# Patient Record
Sex: Male | Born: 2000 | Hispanic: No | Marital: Single | Smoking: Former smoker
Health system: Southern US, Community
[De-identification: ages and names within clinical notes are randomized; demographics above are authoritative.]

## PROBLEM LIST (undated history)

## (undated) DIAGNOSIS — A045 Campylobacter enteritis: Secondary | ICD-10-CM

## (undated) DIAGNOSIS — A498 Other bacterial infections of unspecified site: Secondary | ICD-10-CM

## (undated) DIAGNOSIS — A029 Salmonella infection, unspecified: Secondary | ICD-10-CM

## (undated) HISTORY — DX: Other bacterial infections of unspecified site: A49.8

## (undated) HISTORY — DX: Salmonella infection, unspecified: A02.9

## (undated) HISTORY — DX: Campylobacter enteritis: A04.5

---
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
## 2021-02-02 ENCOUNTER — Ambulatory Visit: Payer: Self-pay | Admitting: Family

## 2021-02-14 ENCOUNTER — Ambulatory Visit
Admission: EM | Admit: 2021-02-14 | Discharge: 2021-02-14 | Disposition: A | Discharge status: Home / Self Care | Payer: Medicaid Other | Attending: Family Medicine | Admitting: Family Medicine

## 2021-02-14 ENCOUNTER — Other Ambulatory Visit: Payer: Self-pay

## 2021-02-14 ENCOUNTER — Ambulatory Visit: Payer: Self-pay

## 2021-02-14 DIAGNOSIS — K5900 Constipation, unspecified: Secondary | ICD-10-CM

## 2021-02-14 DIAGNOSIS — R1013 Epigastric pain: Secondary | ICD-10-CM

## 2021-02-14 MED ORDER — ESOMEPRAZOLE MAGNESIUM 20 MG PO CPDR: DELAYED_RELEASE_CAPSULE | ORAL | 0 refills | Status: DC

## 2021-02-14 MED ORDER — SUCRALFATE 1 G PO TABS: 1.0000 g | ORAL_TABLET | Freq: Three times a day (TID) | ORAL | 0 refills | Status: DC

## 2021-02-14 NOTE — ED Provider Notes (Signed)
  [facility]   [id number] 02/14/21 Arrival Time: 1062  ASSESSMENT & PLAN:  1. Dyspepsia   2. Constipation, unspecified constipation type     Benign abdominal exam. No indications for urgent abdominal/pelvic imaging at this time. Reassured. Symptoms overwhelmingly consistent with acid reflux + constipation.  Meds ordered this encounter  Medications  . esomeprazole (NEXIUM) 20 MG capsule    Sig: Take one capsule daily.    Dispense:  30 capsule    Refill:  0  . sucralfate (CARAFATE) 1 g tablet    Sig: Take 1 tablet (1 g total) by mouth 4 (four) times daily -  with meals and at bedtime.    Dispense:  28 tablet    Refill:  0     Discharge Instructions     Begin taking MIRALAX for your constipation.     Reviewed expectations re: course of current medical issues. Questions answered. Outlined signs and symptoms indicating need for more acute intervention. Patient verbalized understanding. After Visit Summary given.   SUBJECTIVE: History from: patient. Angel Beasley is a 20 y.o. male who presents with complaint of abdominal discomfort desc as feeling bloated; gradual onset past sev days; belching frequently; symptoms worse after eating. Without n/v/d. Does report feeling constipated over past week. Small non-bloody BM this am. Pepto Bismol without relief. No SOB.  Social History   Tobacco Use  Smoking Status Former Smoker  Smokeless Tobacco Never Used    History reviewed. No pertinent surgical history.   OBJECTIVE: General appearance: alert, oriented, no acute distress HEENT: [place]; AT; oropharynx moist Lungs: unlabored respirations Abdomen: soft; without distention; no specific tenderness to palpation; without masses or organomegaly; without guarding or rebound tenderness Back: without reported CVA tenderness; FROM at waist Extremities: without LE edema; symmetrical; without gross deformities Skin: warm and dry Neurologic: normal gait Psychological:  alert and cooperative; normal mood and affect   No Known Allergies                                             History reviewed. No pertinent past medical history.  Social History   Socioeconomic History  . Marital status: Single    Spouse name: Not on file  . Number of children: Not on file  . Years of education: Not on file  . Highest education level: Not on file  Occupational History  . Not on file  Tobacco Use  . Smoking status: Former Games developer  . Smokeless tobacco: Never Used  Substance and Sexual Activity  . Alcohol use: Not on file  . Drug use: Not on file  . Sexual activity: Not on file  Other Topics Concern  . Not on file  Social History Narrative  . Not on file   Social Determinants of Health   Financial Resource Strain: Not on file  Food Insecurity: Not on file  Transportation Needs: Not on file  Physical Activity: Not on file  Stress: Not on file  Social Connections: Not on file  Intimate Partner Violence: Not on file    Family History  Problem Relation Age of Onset  . Diabetes Mother   . Hypertension Mother   . Diabetes Father   . Hypertension Father      Mardella Layman, MD 02/14/21 [phone]

## 2021-02-14 NOTE — ED Triage Notes (Signed)
Pt presents with c/o heartburn since wednesday , took several dosed of pepto and now has constipation

## 2021-02-14 NOTE — Discharge Instructions (Addendum)
Begin taking MIRALAX for your constipation.

## 2021-03-05 ENCOUNTER — Encounter: Payer: Self-pay | Admitting: Nurse Practitioner

## 2021-03-05 ENCOUNTER — Other Ambulatory Visit: Payer: Self-pay

## 2021-03-05 DIAGNOSIS — Z87891 Personal history of nicotine dependence: Secondary | ICD-10-CM | POA: Insufficient documentation

## 2021-03-05 DIAGNOSIS — K219 Gastro-esophageal reflux disease without esophagitis: Secondary | ICD-10-CM | POA: Insufficient documentation

## 2021-03-05 DIAGNOSIS — R1310 Dysphagia, unspecified: Secondary | ICD-10-CM | POA: Insufficient documentation

## 2021-03-05 DIAGNOSIS — G44229 Chronic tension-type headache, not intractable: Secondary | ICD-10-CM | POA: Insufficient documentation

## 2021-03-22 ENCOUNTER — Other Ambulatory Visit: Payer: Self-pay

## 2021-03-22 ENCOUNTER — Encounter: Payer: Self-pay | Admitting: *Deleted

## 2021-03-22 ENCOUNTER — Ambulatory Visit
Admission: EM | Admit: 2021-03-22 | Discharge: 2021-03-22 | Disposition: A | Discharge status: Home / Self Care | Payer: Medicaid Other | Attending: Emergency Medicine | Admitting: Emergency Medicine

## 2021-03-22 DIAGNOSIS — R1033 Periumbilical pain: Secondary | ICD-10-CM

## 2021-03-22 MED ORDER — ESOMEPRAZOLE MAGNESIUM 20 MG PO CPDR: DELAYED_RELEASE_CAPSULE | ORAL | 0 refills | Status: DC

## 2021-03-22 MED ORDER — SUCRALFATE 1 G PO TABS: 1.0000 g | ORAL_TABLET | Freq: Three times a day (TID) | ORAL | 0 refills | Status: DC

## 2021-03-22 MED ORDER — ALUM & MAG HYDROXIDE-SIMETH 200-200-20 MG/5ML PO SUSP
30.0000 mL | Freq: Once | ORAL | Status: AC
  Administered 2021-03-22: 30 mL via ORAL

## 2021-03-22 MED ORDER — LIDOCAINE VISCOUS HCL 2 % MT SOLN
15.0000 mL | Freq: Once | OROMUCOSAL | Status: AC
  Administered 2021-03-22: 15 mL via ORAL

## 2021-03-22 NOTE — ED Triage Notes (Signed)
Pt reports ABD pain that is different from his last visit. Pt took Pepto with out relief. Pt also reports ferquent stools last night.

## 2021-03-22 NOTE — Discharge Instructions (Signed)
Restart daily Nexium Carafate before meals and bedtime Follow-up with gastroenterology as planned Go to emergency room if pain worsening

## 2021-03-22 NOTE — ED Provider Notes (Signed)
[name]    CSN: [id number] Arrival date & time: 03/22/21  1406      History   Chief Complaint Chief Complaint  Patient presents with  . Abdominal Pain    HPI Angel Beasley is a 20 y.o. male presenting today for evaluation of abdominal pain.  Reports approximately 2 to 3 days ago developed a headache with pressure-like sensation on top of his head with associated fatigue, took ibuprofen for this yesterday subsequently developed abdominal pain yesterday afternoon.  Reports pain causing him to be in fetal position and any movement would trigger worsening pain.  Reports pain slightly improved today, described as a crampy/stabbing sensation.  Mainly located in middle abdomen.  Reports bowel slightly looser than normal, but denies any sensations of constipation.  Reports daily morning bowel movements without straining.  Has felt slight nauseous, but no significant vomiting.  Previously on Nexium and Carafate.  Reports significant improvement with Carafate.  Feels need to burp and belch and release air.  Has plans to follow-up with gastroenterology in 2 days on Tuesday.  Denies any recent alcohol use, last used 3 weeks ago.  Quit tobacco use last January.  Denies urinary symptoms.    HPI  History reviewed. No pertinent past medical history.  Patient Active Problem List   Diagnosis Date Noted  . Chronic tension-type headache 03/05/2021  . Dysphagia 03/05/2021  . Former smoker 03/05/2021  . Gastroesophageal reflux disease 03/05/2021  . Seasonal allergic rhinitis due to pollen 04/20/2016    History reviewed. No pertinent surgical history.     Home Medications    Prior to Admission medications   Medication Sig Start Date End Date Taking? Authorizing Provider  esomeprazole (NEXIUM) 20 MG capsule Take one capsule daily. 03/22/21   Yael Angerer C, PA-C  fexofenadine (ALLEGRA) 60 MG tablet Take by mouth.    [provider]  ibuprofen (ADVIL) 800 MG tablet 1  tab(s)    [provider]  sucralfate (CARAFATE) 1 g tablet Take 1 tablet (1 g total) by mouth 4 (four) times daily -  with meals and at bedtime. 03/22/21   Shakinah Navis, Junius Creamer, PA-C    Family History Family History  Problem Relation Age of Onset  . Diabetes Mother   . Hypertension Mother   . Diabetes Father   . Hypertension Father     Social History Social History   Tobacco Use  . Smoking status: Former Games developer  . Smokeless tobacco: Never Used     Allergies   Patient has no known allergies.   Review of Systems Review of Systems  Constitutional: Negative for fatigue and fever.  Eyes: Negative for redness, itching and visual disturbance.  Respiratory: Negative for shortness of breath.   Cardiovascular: Negative for chest pain and leg swelling.  Gastrointestinal: Positive for abdominal pain and nausea. Negative for vomiting.  Musculoskeletal: Negative for arthralgias and myalgias.  Skin: Negative for color change, rash and wound.  Neurological: Negative for dizziness, syncope, weakness, light-headedness and headaches.     Physical Exam Triage Vital Signs ED Triage Vitals  Enc Vitals Group     BP 03/22/21 1447 106/73     Pulse Rate 03/22/21 1447 97     Resp 03/22/21 1447 18     Temp 03/22/21 1447 98.2 F (36.8 C)     Temp Source 03/22/21 1447 Oral     SpO2 --      Weight --      Height --  Head Circumference --      Peak Flow --      Pain Score 03/22/21 1445 4     Pain Loc --      Pain Edu? --      Excl. in GC? --    No data found.  Updated Vital Signs BP 106/73 (BP Location: Right Arm)   Pulse 97   Temp 98.2 F (36.8 C) (Oral)   Resp 18   Visual Acuity Right Eye Distance:   Left Eye Distance:   Bilateral Distance:    Right Eye Near:   Left Eye Near:    Bilateral Near:     Physical Exam Vitals and nursing note reviewed.  Constitutional:      Appearance: He is well-developed.     Comments: No acute distress  HENT:     Head:  Normocephalic and atraumatic.     Nose: Nose normal.  Eyes:     Conjunctiva/sclera: Conjunctivae normal.  Cardiovascular:     Rate and Rhythm: Normal rate and regular rhythm.  Pulmonary:     Effort: Pulmonary effort is normal. No respiratory distress.     Comments: Breathing comfortably at rest, CTABL, no wheezing, rales or other adventitious sounds auscultated Abdominal:     General: There is no distension.     Comments: Soft, nondistended, generalized tenderness throughout abdomen, more prominent tenderness noted to mid lower abdomen, negative rebound, negative Rovsing, negative McBurney's  Musculoskeletal:        General: Normal range of motion.     Cervical back: Neck supple.  Skin:    General: Skin is warm and dry.  Neurological:     Mental Status: He is alert and oriented to person, place, and time.      UC Treatments / Results  Labs (all labs ordered are listed, but only abnormal results are displayed) Labs Reviewed - No data to display  EKG   Radiology No results found.  Procedures Procedures (including critical care time)  Medications Ordered in UC Medications  alum & mag hydroxide-simeth (MAALOX/MYLANTA) 200-200-20 MG/5ML suspension 30 mL (30 mLs Oral Given 03/22/21 1513)    And  lidocaine (XYLOCAINE) 2 % viscous mouth solution 15 mL (15 mLs Oral Given 03/22/21 1512)    Initial Impression / Assessment and Plan / UC Course  I have reviewed the triage vital signs and the nursing notes.  Pertinent labs & imaging results that were available during my care of the patient were reviewed by me and considered in my medical decision making (see chart for details).     Abdominal pain x3 days with slight recent improvement, negative peritoneal signs today, lower suspicion of appendicitis, but given reported pain yesterday advised if pain worsening/progressing to follow-up in emergency room to have further imaging/rule out abdominal emergencies.  At this time will refill  GERD/gastritis meds and restart on Nexium/Carafate.  GI cocktail provided with mild improvement in symptoms.  Follow-up with gastroenterology as planned.  Avoid NSAIDs.  Discussed strict return precautions. Patient verbalized understanding and is agreeable with plan.  Final Clinical Impressions(s) / UC Diagnoses   Final diagnoses:  Periumbilical abdominal pain     Discharge Instructions     Restart daily Nexium Carafate before meals and bedtime Follow-up with gastroenterology as planned Go to emergency room if pain worsening    ED Prescriptions    Medication Sig Dispense Auth. Provider   esomeprazole (NEXIUM) 20 MG capsule Take one capsule daily. 30 capsule Nea Gittens, [place] C, PA-C  sucralfate (CARAFATE) 1 g tablet Take 1 tablet (1 g total) by mouth 4 (four) times daily -  with meals and at bedtime. 28 tablet Stephie Xu, [place] C, PA-C     PDMP not reviewed this encounter.   Lew Dawes, [place] 03/22/21 1644

## 2021-03-24 ENCOUNTER — Encounter: Payer: Self-pay | Admitting: Nurse Practitioner

## 2021-03-24 ENCOUNTER — Other Ambulatory Visit (INDEPENDENT_AMBULATORY_CARE_PROVIDER_SITE_OTHER): Payer: Medicaid Other

## 2021-03-24 ENCOUNTER — Ambulatory Visit (INDEPENDENT_AMBULATORY_CARE_PROVIDER_SITE_OTHER): Payer: Medicaid Other | Admitting: Nurse Practitioner

## 2021-03-24 ENCOUNTER — Other Ambulatory Visit: Payer: Self-pay

## 2021-03-24 VITALS — BP 110/70 | HR 120 | Temp 100.7°F | Ht 67.0 in | Wt 121.0 lb

## 2021-03-24 DIAGNOSIS — R1033 Periumbilical pain: Secondary | ICD-10-CM | POA: Diagnosis not present

## 2021-03-24 DIAGNOSIS — R1031 Right lower quadrant pain: Secondary | ICD-10-CM

## 2021-03-24 DIAGNOSIS — R197 Diarrhea, unspecified: Secondary | ICD-10-CM | POA: Diagnosis not present

## 2021-03-24 LAB — CBC WITH DIFFERENTIAL/PLATELET
Basophils Absolute: 0 10*3/uL (ref 0.0–0.1)
Basophils Relative: 0.2 % (ref 0.0–3.0)
Eosinophils Absolute: 0 10*3/uL (ref 0.0–0.7)
Eosinophils Relative: 0 % (ref 0.0–5.0)
HCT: 42.2 % (ref 39.0–52.0)
Hemoglobin: 14 g/dL (ref 13.0–17.0)
Lymphocytes Relative: 8.1 % — ABNORMAL LOW (ref 12.0–46.0)
Lymphs Abs: 0.8 10*3/uL (ref 0.7–4.0)
MCHC: 33.3 g/dL (ref 30.0–36.0)
MCV: 91.7 fl (ref 78.0–100.0)
Monocytes Absolute: 1.4 10*3/uL — ABNORMAL HIGH (ref 0.1–1.0)
Monocytes Relative: 15.6 % — ABNORMAL HIGH (ref 3.0–12.0)
Neutro Abs: 7 10*3/uL (ref 1.4–7.7)
Neutrophils Relative %: 76.1 % (ref 43.0–77.0)
Platelets: 218 10*3/uL (ref 150.0–400.0)
RBC: 4.6 Mil/uL (ref 4.22–5.81)
RDW: 13 % (ref 11.5–15.5)
WBC: 9.3 10*3/uL (ref 4.0–10.5)

## 2021-03-24 LAB — COMPREHENSIVE METABOLIC PANEL
ALT: 62 U/L — ABNORMAL HIGH (ref 0–53)
AST: 16 U/L (ref 0–37)
Albumin: 3.9 g/dL (ref 3.5–5.2)
Alkaline Phosphatase: 48 U/L (ref 39–117)
BUN: 9 mg/dL (ref 6–23)
CO2: 29 mEq/L (ref 19–32)
Calcium: 9 mg/dL (ref 8.4–10.5)
Chloride: 98 mEq/L (ref 96–112)
Creatinine, Ser: 0.82 mg/dL (ref 0.40–1.50)
GFR: 125.2 mL/min (ref >60.00)
Glucose, Bld: 94 mg/dL (ref 70–99)
Potassium: 3.7 mEq/L (ref 3.5–5.1)
Sodium: 135 mEq/L (ref 135–145)
Total Bilirubin: 0.5 mg/dL (ref 0.2–1.2)
Total Protein: 7.3 g/dL (ref 6.0–8.3)

## 2021-03-24 MED ORDER — METRONIDAZOLE 500 MG PO TABS: 500.0000 mg | ORAL_TABLET | Freq: Two times a day (BID) | ORAL | 0 refills | Status: DC

## 2021-03-24 MED ORDER — CIPROFLOXACIN HCL 500 MG PO TABS: 500.0000 mg | ORAL_TABLET | Freq: Two times a day (BID) | ORAL | 0 refills | Status: DC

## 2021-03-24 NOTE — Progress Notes (Signed)
Agree with assessment and plan as outlined. Initial labs are reassuring, no significant leukocytosis or anemia.  With diarrhea and abdominal pain, infectious enteritis certainly quite possible although with his significant right lower quadrant pain, appendicitis remains on differential.  We were able to coordinate a CT scan of the abdomen pelvis early tomorrow morning to further evaluate this.  Mother was concerned and wanted to bring him to the emergency department, if they do not want to wait for imaging tomorrow that is their only option to get it done sooner, up to them and their comfort level.  He appears to be tolerating liquids okay and maintaining hydration.  We will await GI pathogen panel and if he has evidence of colitis or ileitis would add antibiotics

## 2021-03-24 NOTE — Patient Instructions (Addendum)
You have been scheduled for a STAT CT scan at [facility] 03/25/2021 at 9 am  [place] ,[place]  Their phone number is [phone]  You are scheduled on 03/25/2021 at [place] should arrive 15 minutes prior to your appointment time for registration. Please follow the written instructions below on the day of your exam:  WARNING: IF YOU ARE ALLERGIC TO IODINE/X-RAY DYE, PLEASE NOTIFY RADIOLOGY IMMEDIATELY AT [phone] YOU WILL BE GIVEN A 13 HOUR PREMEDICATION PREP.  1) Do not eat or drink anything after 5am (4 hours prior to your test) 2) You have been given 2 bottles of oral contrast to drink. The solution may taste better if refrigerated, but do NOT add ice or any other liquid to this solution. Shake well before drinking.    Drink 1 bottle of contrast @ 7 am (2 hours prior to your exam)  Drink 1 bottle of contrast @ 8 am  (1 hour prior to your exam)  You may take any medications as prescribed with a small amount of water, if necessary. If you take any of the following medications: METFORMIN, GLUCOPHAGE, GLUCOVANCE, AVANDAMET, RIOMET, FORTAMET, [place] MET, JANUMET, GLUMETZA or METAGLIP, you MAY be asked to HOLD this medication 48 hours AFTER the exam.  The purpose of you drinking the oral contrast is to aid in the visualization of your intestinal tract. The contrast solution may cause some diarrhea. Depending on your individual set of symptoms, you may also receive an intravenous injection of x-ray contrast/dye. Plan on being at [facility] for 30 minutes or longer, depending on the type of exam you are having performed.  This test typically takes 30-45 minutes to complete.  ________________________________________________________________________     Your provider has requested that you go to the basement level for lab work before leaving today. Press "B" on the elevator. The lab is located at the first door on the left as you exit the elevator.   Push  fluids,  do a clear liquid diet for now  Go to ER if severe abdominal pain develops  We will send CIPRO and Flagyl to your pharmacy, do not start these medications until your stool sample is collected  Due to recent changes in healthcare laws, you may see the results of your imaging and laboratory studies on MyChart before your provider has had a chance to review them.  We understand that in some cases there may be results that are confusing or concerning to you. Not all laboratory results come back in the same time frame and the provider may be waiting for multiple results in order to interpret others.  Please give [place] 48 hours in order for your provider to thoroughly review all the results before contacting the office for clarification of your results.   If you are age 22 or older, your body mass index should be between 23-30. Your Body mass index is 18.95 kg/m. If this is out of the aforementioned range listed, please consider follow up with your Primary Care Provider.  If you are age 84 or younger, your body mass index should be between 19-25. Your Body mass index is 18.95 kg/m. If this is out of the aformentioned range listed, please consider follow up with your Primary Care Provider.    I appreciate the  opportunity to care for you  Thank You   Marcella Dubs

## 2021-03-24 NOTE — Progress Notes (Signed)
03/24/2021 Angel Beasley [id number] 07-20-2001   CHIEF COMPLAINT: Abdominal pain, diarrhea, fever   HISTORY OF PRESENT ILLNESS:  Angel Beasley is a 20 year old male with a past medical history of GERD symptoms. No past surgeries. He presents to our office today accompanied by his mother for further evaluation regarding fever, abdominal pain and diarrhea. He developed fatigue and headaches on 4/15. He developed periumbilical and RLQ pain with the onset of nonbloody diarrhea on Saturday 03/21/2021. No obvious food triggers. No recent antibiotics. He reported passing 10 episodes of diarrhea on 4/16 and 3 to 4 episodes on Sunday 4/17 with worsening abdominal pain so he presented to the urgent care clinic for further evaluation. At that time, he had generalized abdominal tenderness, more prominent to the RLQ without rebound. He was afebrile at that time and nontoxic appearing so he was discharged home. No labs or abdominal imaging was done. He continues to have periumbilical and RLQ pain today. He passed 3 episodes of diarrhea thus far today. No NSAID or Tylenol today. His temperature in office is 100.80F. HR 120. He is tolerating water, reports he drank 32 ounces of water today. He is urinating clear yellow urine each time he passes a diarrhea BM. Some nausea. No vomiting. He is taking Esomeprazole 20mg  QD for heartburn/indigestion and Sucralfate 1gm po tid since 02/2021.    Social History: Nonsmoker. No alcohol. No drug use.   Family History: Mother age 20 healthy. Father age 8's healthy, smoker.  Maternal grandmother had appendectomy. No family history of colon cancer or IBD.   No Known Allergies    Outpatient Encounter Medications as of 03/24/2021  Medication Sig  . esomeprazole (NEXIUM) 20 MG capsule Take one capsule daily.  . fexofenadine (ALLEGRA) 60 MG tablet Take by mouth.  03/26/2021 ibuprofen (ADVIL) 800 MG tablet 1 tab(s)  . sucralfate (CARAFATE) 1 g tablet Take 1 tablet (1 g total) by mouth 4  (four) times daily -  with meals and at bedtime.   No facility-administered encounter medications on file as of 03/24/2021.     REVIEW OF SYSTEMS: See HPI, all other systems reviewed and are negative    PHYSICAL EXAM: BP 110/70   Pulse (!) 120   Temp (!) 100.7 F (38.2 C)   Ht 5\' 7"  (1.702 m)   Wt 121 lb (54.9 kg)   SpO2 97%   BMI 18.95 kg/m  General: 20 year old male in no acute distress. Head: Normocephalic and atraumatic. Eyes:  Sclerae non-icteric, conjunctive pink. Ears: Normal auditory acuity. Mouth: Dentition intact. No ulcers or lesions.  Neck: Supple, no lymphadenopathy or thyromegaly.  Lungs: Clear bilaterally to auscultation without wheezes, crackles or rhonchi. Heart: Regular rate and rhythm. No murmur, rub or gallop appreciated.  Abdomen: Soft, nondistended. Moderate periumbilical and RLQ tenderness without rebound or guarding. No masses. No hepatosplenomegaly. Normoactive bowel sounds x 4 quadrants.  Rectal: Deferred.  Musculoskeletal: Symmetrical with no gross deformities. Skin: Warm and dry. No rash or lesions on visible extremities. Extremities: No edema. Neurological: Alert oriented x 4, no focal deficits.  Psychological:  Alert and cooperative. Normal mood and affect.  ASSESSMENT AND PLAN:  92. 20 year old male with diarrhea, periumbilical and RLQ pain. Temp 100.7 F. Tachycardic with HR 120. -ER evaluation deferred at this time per Dr. 2 -CBC, CMP stat -Stat CTAP with oral and IV contrast to rule out enteritis/colitis/appendicitis  -GI pathogen panel  -Empirically treat for infectious enteritis/colitis with Cipro 500mg   one po bid x 7 days and Flagyl 500mg  one po bid x 7 days -Patient instructed to present to the ED if his abdominal pain worsens  -Push fluids   2. GERD symptoms, stable  -Continue Esomeprazole 40mg  QD for now    CC:  , 

## 2021-03-25 ENCOUNTER — Ambulatory Visit
Admission: RE | Admit: 2021-03-25 | Discharge: 2021-03-25 | Disposition: A | Discharge status: Home / Self Care | Payer: Medicaid Other | Source: Ambulatory Visit | Attending: Nurse Practitioner | Admitting: Nurse Practitioner

## 2021-03-25 ENCOUNTER — Other Ambulatory Visit: Payer: Self-pay

## 2021-03-25 DIAGNOSIS — R1031 Right lower quadrant pain: Secondary | ICD-10-CM

## 2021-03-25 DIAGNOSIS — R748 Abnormal levels of other serum enzymes: Secondary | ICD-10-CM

## 2021-03-25 IMAGING — CT CT ABD-PELV W/ CM
2 of 4 series · 11 of 46 positions shown, 12 images · IV contrast (iopamidol)
Comparison: None.
COMPARISON: None.
COMPARISON: None.

Addendum:
CLINICAL DATA: 19-year-old male with RIGHT lower quadrant pain
duration of symptoms for 4 days.

EXAM:
CT ABDOMEN AND PELVIS WITH CONTRAST
TECHNIQUE: Multidetector CT imaging of the abdomen and pelvis was performed
using the standard protocol following bolus administration of
intravenous contrast.
CONTRAST:  100mL [AY] IOPAMIDOL ([AY]) INJECTION 61%

[Series 2: abd pelvis 5.00 br40 s3 axial · axial · 0.63mm/px · z∈[+1420,+1790]mm · 8 of 88 slices shown, 9 images]
[im 7/88  soft-tissue]
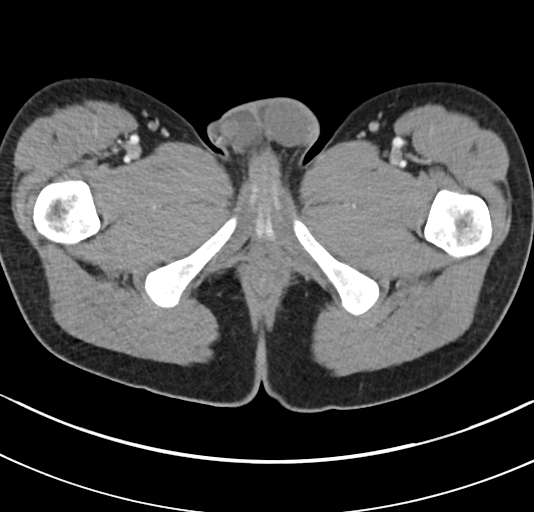
[im 7/88  bone]
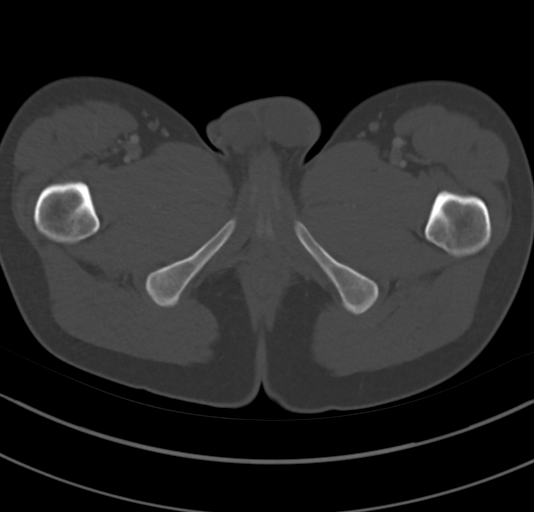
[im 18/88  soft-tissue]
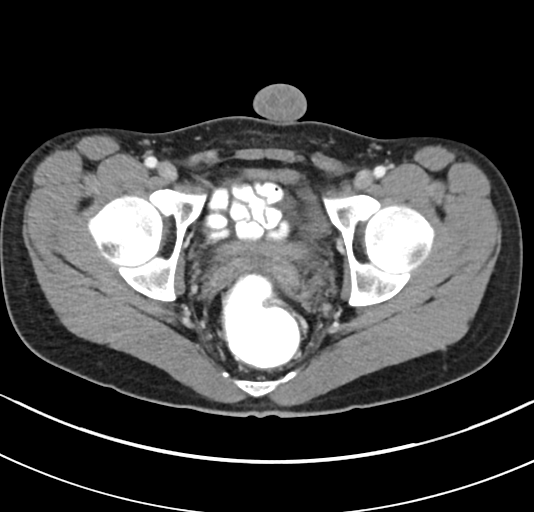
[im 28/88  soft-tissue]
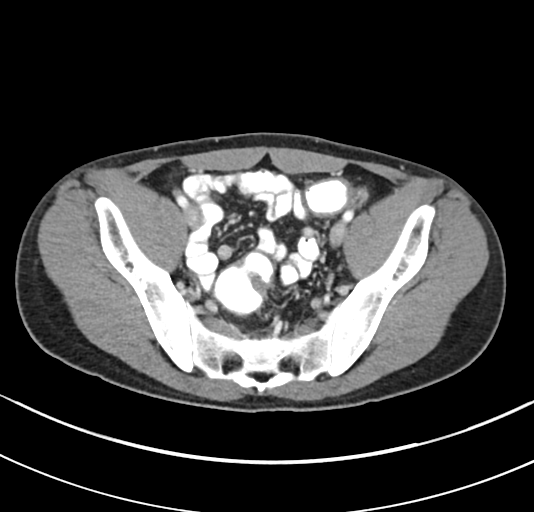
[im 39/88  soft-tissue]
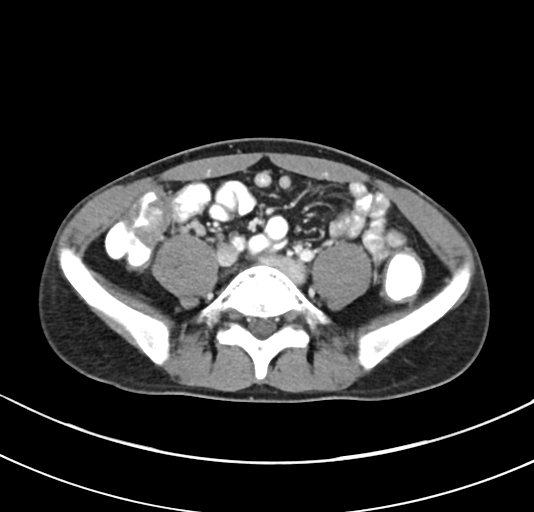
[im 49/88  soft-tissue]
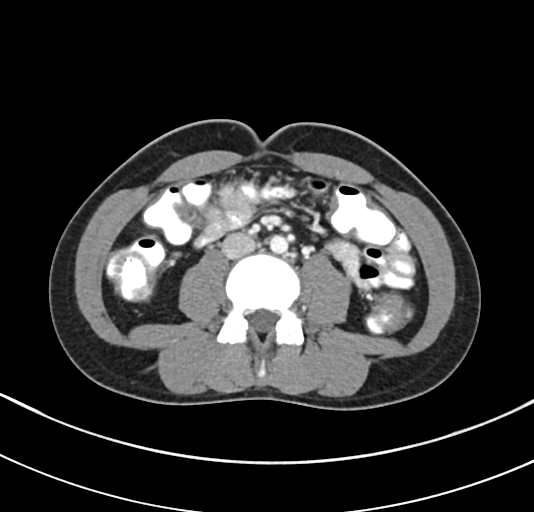
[im 60/88  soft-tissue]
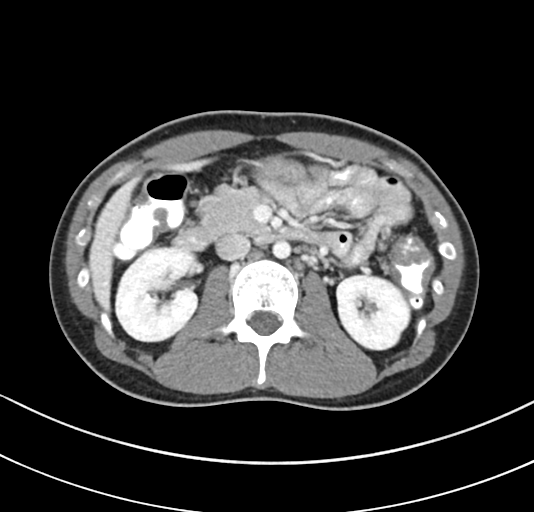
[im 70/88  soft-tissue]
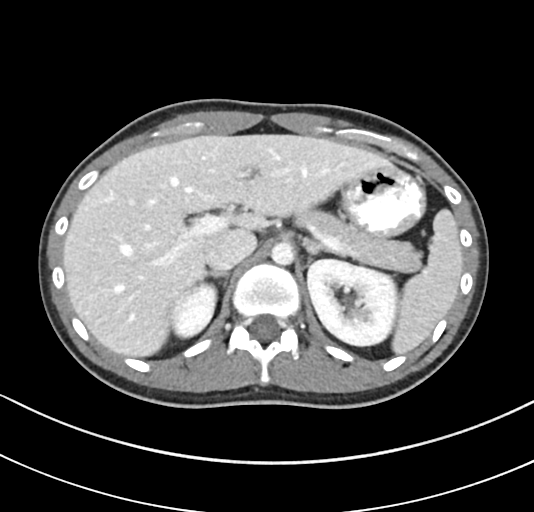
[im 81/88  soft-tissue]
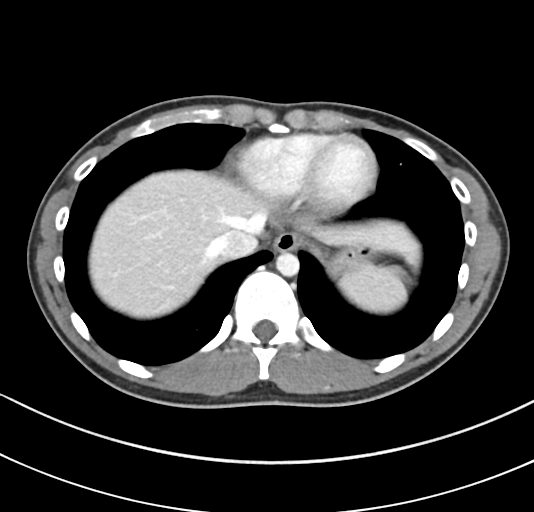

[Series 6: abd pelvis 2.00 br40 s3 cor · coronal · 0.66mm/px · 3 of 122 slices shown]
[im 41/122  soft-tissue]
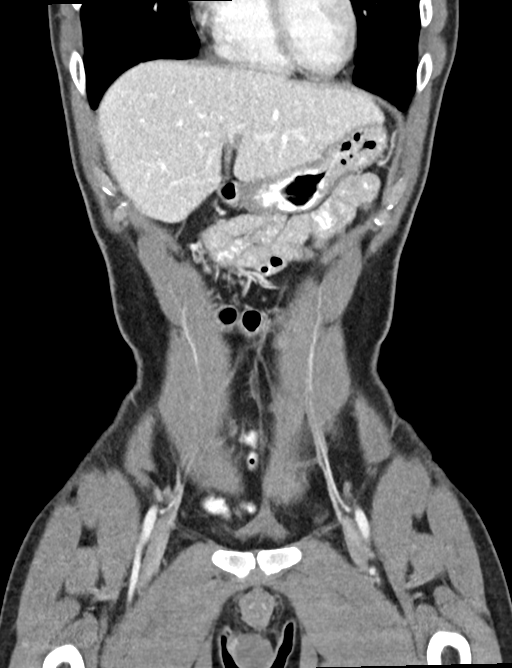
[im 54/122  soft-tissue]
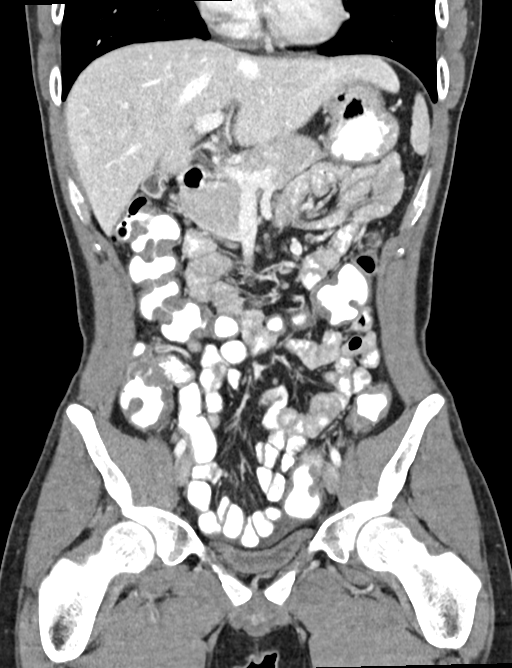
[im 68/122  soft-tissue]
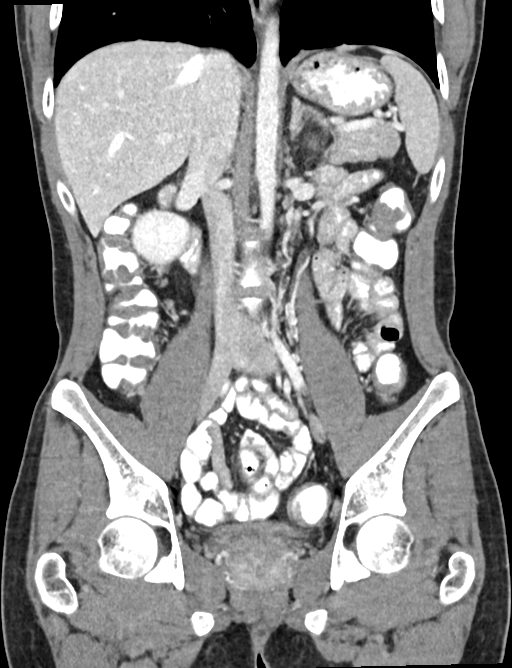

[11 of 46 positions shown; findings below may reference images not displayed]

FINDINGS: Lower chest: No effusion.  No consolidation.

Hepatobiliary: Liver and biliary tree are unremarkable. Portal vein
is patent. Gallbladder is collapsed.

Pancreas: Normal, without mass, inflammation or ductal dilatation.

Spleen: Normal size contour without focal lesion.

Adrenals/Urinary Tract: Adrenal glands are normal. Symmetric renal
enhancement. No hydronephrosis. Urinary bladder is collapsed, no
gross abnormality.

Stomach/Bowel: Stomach is collapsed.  No perigastric stranding.

Small bowel normal caliber.

Irregular appearance of the terminal ileum. No adjacent stranding.
This is associated with wall thickening and potentially nodular
mucosal pattern.

The appendix is visible small amount of gas in the tip. No
periappendiceal stranding. Mild wall thickening may be present.

Diffuse haustral thickening and wall thickening of the colon. No
pneumatosis.

Vascular/Lymphatic: Normal caliber abdominal aorta. Patent abdominal
vasculature including splenic portal venous system. Normal caliber
IVC with smooth contours. There is no gastrohepatic or
hepatoduodenal ligament lymphadenopathy. No retroperitoneal or
mesenteric lymphadenopathy.

No pelvic sidewall lymphadenopathy.

Reproductive: Unremarkable

Other: No ascites.

Musculoskeletal: No acute or destructive bone process.
IMPRESSION: 1. Pancolonic thickening and thickened haustra compatible with
diffuse colitis, potentially associated with ileal inflammation.
Pattern could be seen in the setting of infectious or inflammatory
colitis/enteritis. Correlate with any risk factors for infectious
colitis such as recent antibiotic administration. Would also
consider inflammatory bowel disease in this age group.
2. Appendix with small amount of oral contrast media in the lumen,
suggestion of mild wall thickening but without periappendiceal
stranding favored to represent mild secondary appendiceal
inflammation.
3. Top-normal size lymph nodes in the ileocolic mesentery largest
approximately 1 cm likely reactive.

ADDENDUM:
These results will be called to the ordering clinician or
representative by the Radiologist Assistant, and communication
documented in the PACS or [REDACTED].

ADDENDUM:
Presence of moderate to marked colitis may warrant close follow-up.
This was discussed with the provider who is in communication with
the patient.

These results were called by telephone at the time of interpretation
on [DATE] at [DATE] to provider ESPE , who
verbally acknowledged these results.

*** End of Addendum ***
Addendum:
FINDINGS: Lower chest: No effusion.  No consolidation.

Hepatobiliary: Liver and biliary tree are unremarkable. Portal vein
is patent. Gallbladder is collapsed.

Pancreas: Normal, without mass, inflammation or ductal dilatation.

Spleen: Normal size contour without focal lesion.

Adrenals/Urinary Tract: Adrenal glands are normal. Symmetric renal
enhancement. No hydronephrosis. Urinary bladder is collapsed, no
gross abnormality.

Stomach/Bowel: Stomach is collapsed.  No perigastric stranding.

Small bowel normal caliber.

Irregular appearance of the terminal ileum. No adjacent stranding.
This is associated with wall thickening and potentially nodular
mucosal pattern.

The appendix is visible small amount of gas in the tip. No
periappendiceal stranding. Mild wall thickening may be present.

Diffuse haustral thickening and wall thickening of the colon. No
pneumatosis.

Vascular/Lymphatic: Normal caliber abdominal aorta. Patent abdominal
vasculature including splenic portal venous system. Normal caliber
IVC with smooth contours. There is no gastrohepatic or
hepatoduodenal ligament lymphadenopathy. No retroperitoneal or
mesenteric lymphadenopathy.

No pelvic sidewall lymphadenopathy.

Reproductive: Unremarkable

Other: No ascites.

Musculoskeletal: No acute or destructive bone process.
IMPRESSION: 1. Pancolonic thickening and thickened haustra compatible with
diffuse colitis, potentially associated with ileal inflammation.
Pattern could be seen in the setting of infectious or inflammatory
colitis/enteritis. Correlate with any risk factors for infectious
colitis such as recent antibiotic administration. Would also
consider inflammatory bowel disease in this age group.
2. Appendix with small amount of oral contrast media in the lumen,
suggestion of mild wall thickening but without periappendiceal
stranding favored to represent mild secondary appendiceal
inflammation.
3. Top-normal size lymph nodes in the ileocolic mesentery largest
approximately 1 cm likely reactive.

ADDENDUM:
These results will be called to the ordering clinician or
representative by the Radiologist Assistant, and communication
documented in the PACS or [REDACTED].

*** End of Addendum ***
FINDINGS: Lower chest: No effusion.  No consolidation.

Hepatobiliary: Liver and biliary tree are unremarkable. Portal vein
is patent. Gallbladder is collapsed.

Pancreas: Normal, without mass, inflammation or ductal dilatation.

Spleen: Normal size contour without focal lesion.

Adrenals/Urinary Tract: Adrenal glands are normal. Symmetric renal
enhancement. No hydronephrosis. Urinary bladder is collapsed, no
gross abnormality.

Stomach/Bowel: Stomach is collapsed.  No perigastric stranding.

Small bowel normal caliber.

Irregular appearance of the terminal ileum. No adjacent stranding.
This is associated with wall thickening and potentially nodular
mucosal pattern.

The appendix is visible small amount of gas in the tip. No
periappendiceal stranding. Mild wall thickening may be present.

Diffuse haustral thickening and wall thickening of the colon. No
pneumatosis.

Vascular/Lymphatic: Normal caliber abdominal aorta. Patent abdominal
vasculature including splenic portal venous system. Normal caliber
IVC with smooth contours. There is no gastrohepatic or
hepatoduodenal ligament lymphadenopathy. No retroperitoneal or
mesenteric lymphadenopathy.

No pelvic sidewall lymphadenopathy.

Reproductive: Unremarkable

Other: No ascites.

Musculoskeletal: No acute or destructive bone process.
IMPRESSION: 1. Pancolonic thickening and thickened haustra compatible with
diffuse colitis, potentially associated with ileal inflammation.
Pattern could be seen in the setting of infectious or inflammatory
colitis/enteritis. Correlate with any risk factors for infectious
colitis such as recent antibiotic administration. Would also
consider inflammatory bowel disease in this age group.
2. Appendix with small amount of oral contrast media in the lumen,
suggestion of mild wall thickening but without periappendiceal
stranding favored to represent mild secondary appendiceal
inflammation.
3. Top-normal size lymph nodes in the ileocolic mesentery largest
approximately 1 cm likely reactive.

## 2021-03-25 MED ORDER — IOPAMIDOL (ISOVUE-300) INJECTION 61%
100.0000 mL | Freq: Once | INTRAVENOUS | Status: AC | PRN
  Administered 2021-03-25: 100 mL via INTRAVENOUS

## 2021-03-26 ENCOUNTER — Other Ambulatory Visit: Payer: Self-pay

## 2021-03-26 ENCOUNTER — Other Ambulatory Visit: Payer: Self-pay | Admitting: Nurse Practitioner

## 2021-03-26 LAB — GI PROFILE, STOOL, PCR
Adenovirus F 40/41: NOT DETECTED
Astrovirus: NOT DETECTED
C difficile toxin A/B: DETECTED — AB
Campylobacter: DETECTED — AB
Cryptosporidium: NOT DETECTED
Cyclospora cayetanensis: NOT DETECTED
Entamoeba histolytica: NOT DETECTED
Enteroaggregative E coli: NOT DETECTED
Enteropathogenic E coli: NOT DETECTED
Enterotoxigenic E coli: NOT DETECTED
Giardia lamblia: NOT DETECTED
Norovirus GI/GII: NOT DETECTED
Plesiomonas shigelloides: NOT DETECTED
Rotavirus A: NOT DETECTED
Salmonella: DETECTED — AB
Sapovirus: NOT DETECTED
Shiga-toxin-producing E coli: NOT DETECTED
Shigella/Enteroinvasive E coli: NOT DETECTED
Vibrio cholerae: NOT DETECTED
Vibrio: NOT DETECTED
Yersinia enterocolitica: NOT DETECTED

## 2021-03-26 MED ORDER — VANCOMYCIN HCL 125 MG PO CAPS: 125.0000 mg | ORAL_CAPSULE | Freq: Four times a day (QID) | ORAL | 0 refills | Status: AC

## 2021-03-26 MED ORDER — SACCHAROMYCES BOULARDII 250 MG PO CAPS: 250.0000 mg | ORAL_CAPSULE | Freq: Two times a day (BID) | ORAL | 1 refills | Status: DC

## 2021-03-26 MED ORDER — AZITHROMYCIN 500 MG PO TABS: 500.0000 mg | ORAL_TABLET | Freq: Every day | ORAL | 0 refills | Status: AC

## 2021-03-26 NOTE — Telephone Encounter (Signed)
Beth, pls provide the patient with a work excuse letter for this entire week . To return to work on Monday 4/25 if his diarrhea abated. Pt should call office on Monday if no significant improvement. Patient to call office if his symptoms worsen. Thx

## 2021-03-31 ENCOUNTER — Telehealth: Payer: Self-pay | Admitting: Nurse Practitioner

## 2021-03-31 NOTE — Telephone Encounter (Signed)
I attempted to contact the patient for symptom update.  I called his mobile phone and home phone, no answer, voicemail did not pick up. I will try to call patient again later today.

## 2021-04-09 ENCOUNTER — Ambulatory Visit: Payer: Medicaid Other | Admitting: Gastroenterology

## 2021-04-14 ENCOUNTER — Ambulatory Visit: Payer: Medicaid Other | Admitting: Gastroenterology

## 2021-04-14 ENCOUNTER — Other Ambulatory Visit (INDEPENDENT_AMBULATORY_CARE_PROVIDER_SITE_OTHER): Payer: Medicaid Other

## 2021-04-14 ENCOUNTER — Other Ambulatory Visit: Payer: Self-pay

## 2021-04-14 ENCOUNTER — Encounter: Payer: Self-pay | Admitting: Gastroenterology

## 2021-04-14 VITALS — BP 112/60 | HR 80 | Ht 67.0 in | Wt 122.0 lb

## 2021-04-14 DIAGNOSIS — R748 Abnormal levels of other serum enzymes: Secondary | ICD-10-CM

## 2021-04-14 DIAGNOSIS — R1310 Dysphagia, unspecified: Secondary | ICD-10-CM

## 2021-04-14 DIAGNOSIS — K219 Gastro-esophageal reflux disease without esophagitis: Secondary | ICD-10-CM

## 2021-04-14 DIAGNOSIS — A09 Infectious gastroenteritis and colitis, unspecified: Secondary | ICD-10-CM

## 2021-04-14 DIAGNOSIS — R7401 Elevation of levels of liver transaminase levels: Secondary | ICD-10-CM | POA: Diagnosis not present

## 2021-04-14 LAB — HEPATIC FUNCTION PANEL
ALT: 166 U/L — ABNORMAL HIGH (ref 0–53)
AST: 58 U/L — ABNORMAL HIGH (ref 0–37)
Albumin: 4.5 g/dL (ref 3.5–5.2)
Alkaline Phosphatase: 41 U/L (ref 39–117)
Bilirubin, Direct: 0.2 mg/dL (ref 0.0–0.3)
Total Bilirubin: 0.8 mg/dL (ref 0.2–1.2)
Total Protein: 7.5 g/dL (ref 6.0–8.3)

## 2021-04-14 MED ORDER — FAMOTIDINE 20 MG PO TABS: 20.0000 mg | ORAL_TABLET | Freq: Every day | ORAL | 3 refills | Status: AC | PRN

## 2021-04-14 NOTE — Progress Notes (Signed)
HPI :  20 year old male here for a follow-up visit for colitis, GERD, elevated liver enzymes.  Recall that he was seen here for new patient visit in April, was referred here initially for persistent reflux symptoms.  About 2 days before his visit he developed acute onset of severe diarrhea with abdominal pain.  He had developed a fever and tachycardia.  Denies any blood in his stools.  He had no antibiotic exposure prior to that and no significant NSAID use.  He had a CT scan of the abdomen pelvis which showed pancolonic colitis with suspected reactive lymphadenopathy.  He had a GI pathogen panel which was positive for C. difficile, Campylobacter, and Salmonella.  We treated the C. difficile with vancomycin, treated the Campylobacter with azithromycin.  He denies any chronic symptoms of loose stools or abdominal pain prior to this occurrence.  He states he ate some chicken liver that perhaps was uncooked in the days preceding this occurrence.  He denies any other sick contacts.  He has no family history of Crohn's disease or colitis.  He was treated with the antibiotics and essentially feels normal at this time.  He states his bowels have come back completely to normal, he has 1 bowel movement morning.  He has no abdominal pains at all.  He is feeling much better in this regard.  During this time he was noted to have a mild ALT elevation to the 60s which was new for him.  He denies any history of liver disease.  Otherwise he had some intermittent reflux over the years, states he had bloating and indigestion, sometimes after he eats as well as heartburn.  He was taking some Nexium over-the-counter which have provided some benefit.  At one point time he had some dysphagia with pills and is her throat area as well as some solid food dysphagia.  He would drink fluids to push things down.  He states that symptom essentially went away and he is not really having any problems with dysphagia or reflux at this  point in time he generally feels well.  He is never had an EGD or colonoscopy before.  He has not had any follow-up labs since this occurred.   CT abdomen / pelvis with contrast 03/26/21 - FINDINGS: IMPRESSION: 1. Pancolonic thickening and thickened haustra compatible with diffuse colitis, potentially associated with ileal inflammation. Pattern could be seen in the setting of infectious or inflammatory colitis/enteritis. Correlate with any risk factors for infectious colitis such as recent antibiotic administration. Would also consider inflammatory bowel disease in this age group. 2. Appendix with small amount of oral contrast media in the lumen, suggestion of mild wall thickening but without periappendiceal stranding favored to represent mild secondary appendiceal inflammation. 3. Top-normal size lymph nodes in the ileocolic mesentery largest approximately 1 cm likely reactive.   Past Medical History:  Diagnosis Date  . Campylobacter intestinal infection   . Clostridium difficile infection   . Salmonella      No past surgical history on file. Family History  Problem Relation Age of Onset  . Diabetes Mother   . Hypertension Mother   . Diabetes Father   . Hypertension Father    Social History   Tobacco Use  . Smoking status: Former Games developer  . Smokeless tobacco: Never Used   Current Outpatient Medications  Medication Sig Dispense Refill  . famotidine (PEPCID) 20 MG tablet Take 1 tablet (20 mg total) by mouth daily as needed for heartburn or indigestion. 30 tablet  3  . fexofenadine (ALLEGRA) 60 MG tablet Take by mouth.    Marland Kitchen ibuprofen (ADVIL) 800 MG tablet 1 tab(s)     No current facility-administered medications for this visit.   No Known Allergies   Review of Systems: All systems reviewed and negative except where noted in HPI.    CT Abdomen Pelvis W Contrast  Addendum Date: 03/26/2021   ADDENDUM REPORT: 03/26/2021 13:02 ADDENDUM: Presence of moderate to marked  colitis may warrant close follow-up. This was discussed with the provider who is in communication with the patient. These results were called by telephone at the time of interpretation on 03/26/2021 at 1:02 pm to provider [facility] , who verbally acknowledged these results. Electronically Signed   By: Donzetta Kohut M.D.   On: 03/26/2021 13:02   Addendum Date: 03/25/2021   ADDENDUM REPORT: 03/25/2021 16:36 ADDENDUM: These results will be called to the ordering clinician or representative by the Radiologist Assistant, and communication documented in the PACS or [employer]. Electronically Signed   By: Donzetta Kohut M.D.   On: 03/25/2021 16:36   Result Date: 03/26/2021 CLINICAL DATA:  20 year old male with RIGHT lower quadrant pain duration of symptoms for 4 days. EXAM: CT ABDOMEN AND PELVIS WITH CONTRAST TECHNIQUE: Multidetector CT imaging of the abdomen and pelvis was performed using the standard protocol following bolus administration of intravenous contrast. CONTRAST:  ISOVUE-300 IOPAMIDOL (ISOVUE-300) INJECTION 61% COMPARISON:  None. FINDINGS: Lower chest: No effusion.  No consolidation. Hepatobiliary: Liver and biliary tree are unremarkable. Portal vein is patent. Gallbladder is collapsed. Pancreas: Normal, without mass, inflammation or ductal dilatation. Spleen: Normal size contour without focal lesion. Adrenals/Urinary Tract: Adrenal glands are normal. Symmetric renal enhancement. No hydronephrosis. Urinary bladder is collapsed, no gross abnormality. Stomach/Bowel: Stomach is collapsed.  No perigastric stranding. Small bowel normal caliber. Irregular appearance of the terminal ileum. No adjacent stranding. This is associated with wall thickening and potentially nodular mucosal pattern. The appendix is visible small amount of gas in the tip. No periappendiceal stranding. Mild wall thickening may be present. Diffuse haustral thickening and wall thickening of the colon. No pneumatosis.  Vascular/Lymphatic: Normal caliber abdominal aorta. Patent abdominal vasculature including splenic portal venous system. Normal caliber IVC with smooth contours. There is no gastrohepatic or hepatoduodenal ligament lymphadenopathy. No retroperitoneal or mesenteric lymphadenopathy. No pelvic sidewall lymphadenopathy. Reproductive: Unremarkable Other: No ascites. Musculoskeletal: No acute or destructive bone process. IMPRESSION: 1. Pancolonic thickening and thickened haustra compatible with diffuse colitis, potentially associated with ileal inflammation. Pattern could be seen in the setting of infectious or inflammatory colitis/enteritis. Correlate with any risk factors for infectious colitis such as recent antibiotic administration. Would also consider inflammatory bowel disease in this age group. 2. Appendix with small amount of oral contrast media in the lumen, suggestion of mild wall thickening but without periappendiceal stranding favored to represent mild secondary appendiceal inflammation. 3. Top-normal size lymph nodes in the ileocolic mesentery largest approximately 1 cm likely reactive. Electronically Signed: By: Donzetta Kohut M.D. On: 03/25/2021 09:51   Lab Results  Component Value Date   WBC 9.3 03/24/2021   HGB 14.0 03/24/2021   HCT 42.2 03/24/2021   MCV 91.7 03/24/2021   PLT 218.0 03/24/2021    Lab Results  Component Value Date   CREATININE 0.82 03/24/2021   BUN 9 03/24/2021   NA 135 03/24/2021   K 3.7 03/24/2021   CL 98 03/24/2021   CO2 29 03/24/2021      Physical Exam: BP 112/60  Pulse 80   Ht 5\' 7"  (1.702 m)   Wt 122 lb (55.3 kg)   BMI 19.11 kg/m  Constitutional: Pleasant,well-developed, male in no acute distress. HEENT: Normocephalic and atraumatic. Conjunctivae are normal. No scleral icterus. Neck supple.  Cardiovascular: Normal rate, regular rhythm.  Pulmonary/chest: Effort normal and breath sounds normal. No wheezing, rales or rhonchi. Abdominal: Soft,  nondistended, nontender. There are no masses palpable. . Extremities: no edema Lymphadenopathy: No cervical adenopathy noted. Neurological: Alert and oriented to person place and time. Skin: Skin is warm and dry. No rashes noted. Psychiatric: Normal mood and affect. Behavior is normal.   ASSESSMENT AND PLAN: 20 year old male here for reassessment of the following:  Infectious colitis GERD Dysphagia Elevated ALT  As above, acute onset severe diarrhea with abdominal pain.  CT showed significant colitis.  GI pathogen panel positive for C. difficile, Campylobacter, Salmonella which was treated as above.  His risk factor for this was eating undercooked chicken liver, no other sick contacts.  He has responded quite well to antibiotics and symptoms have resolved.  I discussed each of these infections with him, unclear if he truly had all of these or if he had a false positive on the GI pathogen panel to one or more of these as well.  I think underlying IBD is extremely unlikely in this situation.  I discussed if he wanted a follow-up colonoscopy to ensure things okay, he declines colonoscopy after discussion of this.  He will monitor for recurrence of symptoms.  We discussed his baseline reflux symptoms that appear intermittent he responded to over-the-counter PPI as needed.  He previously had some intermittent solid food dysphagia but that has resolved.  We discussed if he wanted to pursue an evaluation of his esophagus at this time and he does not given the symptoms resolved.  If he does have recurrent reflux I would recommend using Pepcid as needed if symptoms are mild to reduce his risk for having recurrent C. difficile.  Otherwise, elevated ALT may likely have been reactive to his infections at the time but will repeat LFTs to ensure normalization.  He agreed with the plan, can follow-up as needed for any recurrent symptoms  Plan: - patient back to normal baseline - he declines colonoscopy / EGD at  this time - would use pepcid PRN if recurrent reflux symptoms to reduce risk of recurrent C diff - cook chicken / meat well prior to ingestion - repeat LFTs today, further workup if persistent - follow up as needed  34, MD [facility]

## 2021-04-14 NOTE — Patient Instructions (Addendum)
If you are age 22 or older, your body mass index should be between 23-30. Your Body mass index is 19.11 kg/m. If this is out of the aforementioned range listed, please consider follow up with your Primary Care Provider.  If you are age 67 or younger, your body mass index should be between 19-25. Your Body mass index is 19.11 kg/m. If this is out of the aformentioned range listed, please consider follow up with your Primary Care Provider.   Please go to the lab in the basement of our building to have lab work done as you leave today. Hit "B" for basement when you get on the elevator.  When the doors open the lab is on your left.  We will call you with the results. Thank you.  Due to recent changes in healthcare laws, you may see the results of your imaging and laboratory studies on MyChart before your provider has had a chance to review them.  We understand that in some cases there may be results that are confusing or concerning to you. Not all laboratory results come back in the same time frame and the provider may be waiting for multiple results in order to interpret others.  Please give [place] 48 hours in order for your provider to thoroughly review all the results before contacting the office for clarification of your results.    We have sent the following medications to your pharmacy for you to pick up at your convenience: Pepcid 20 mg: Take once daily as needed  Thank you for entrusting me with your care and for choosing [facility], Dr. Ileene Patrick

## 2021-04-20 ENCOUNTER — Other Ambulatory Visit (INDEPENDENT_AMBULATORY_CARE_PROVIDER_SITE_OTHER): Payer: Medicaid Other

## 2021-04-20 DIAGNOSIS — K219 Gastro-esophageal reflux disease without esophagitis: Secondary | ICD-10-CM | POA: Diagnosis not present

## 2021-04-20 DIAGNOSIS — R748 Abnormal levels of other serum enzymes: Secondary | ICD-10-CM | POA: Diagnosis not present

## 2021-04-20 DIAGNOSIS — R7401 Elevation of levels of liver transaminase levels: Secondary | ICD-10-CM | POA: Diagnosis not present

## 2021-04-20 DIAGNOSIS — A09 Infectious gastroenteritis and colitis, unspecified: Secondary | ICD-10-CM

## 2021-04-20 LAB — IBC + FERRITIN
Ferritin: 148.6 ng/mL (ref 22.0–322.0)
Iron: 172 ug/dL — ABNORMAL HIGH (ref 42–165)
Saturation Ratios: 46.7 % (ref 20.0–50.0)
Transferrin: 263 mg/dL (ref 212.0–360.0)

## 2021-04-22 ENCOUNTER — Telehealth: Payer: Self-pay

## 2021-04-22 DIAGNOSIS — R7401 Elevation of levels of liver transaminase levels: Secondary | ICD-10-CM

## 2021-04-22 DIAGNOSIS — A09 Infectious gastroenteritis and colitis, unspecified: Secondary | ICD-10-CM

## 2021-04-22 DIAGNOSIS — K219 Gastro-esophageal reflux disease without esophagitis: Secondary | ICD-10-CM

## 2021-04-22 DIAGNOSIS — R748 Abnormal levels of other serum enzymes: Secondary | ICD-10-CM

## 2021-04-22 LAB — ANTI-NUCLEAR AB-TITER (ANA TITER): ANA Titer 1: 1:80 {titer} — ABNORMAL HIGH

## 2021-04-22 LAB — HEPATITIS A ANTIBODY, TOTAL: Hepatitis A AB,Total: REACTIVE — AB

## 2021-04-22 LAB — CERULOPLASMIN: Ceruloplasmin: 20 mg/dL (ref 18–36)

## 2021-04-22 LAB — HEPATITIS B CORE ANTIBODY, TOTAL: Hep B Core Total Ab: NONREACTIVE

## 2021-04-22 LAB — IGG: IgG (Immunoglobin G), Serum: 1192 mg/dL (ref 600–1640)

## 2021-04-22 LAB — ANTI-SMOOTH MUSCLE ANTIBODY, IGG: Actin (Smooth Muscle) Antibody (IGG): <20 U (ref <20)

## 2021-04-22 LAB — HEPATITIS C ANTIBODY
Hepatitis C Ab: NONREACTIVE
SIGNAL TO CUT-OFF: 0.01 (ref <1.00)

## 2021-04-22 LAB — HEPATITIS B SURFACE ANTIGEN: Hepatitis B Surface Ag: NONREACTIVE

## 2021-04-22 LAB — HEPATITIS B SURFACE ANTIBODY,QUALITATIVE: Hep B S Ab: NONREACTIVE

## 2021-04-22 LAB — ANA: Anti Nuclear Antibody (ANA): POSITIVE — AB

## 2021-04-22 LAB — ALPHA-1-ANTITRYPSIN: A-1 Antitrypsin, Ser: 121 mg/dL (ref 83–199)

## 2021-04-22 NOTE — Telephone Encounter (Signed)
Called patient twice, phone rings for several minutes, unable to leave a voicemail.   My Chart message sent to patient with lab reminder.

## 2021-04-22 NOTE — Telephone Encounter (Signed)
-----   Message from Missy Sabins, RN sent at 04/15/2021 10:35 AM EDT ----- Regarding: Labs Hepatic function panel, need to place order.

## 2021-04-23 ENCOUNTER — Other Ambulatory Visit: Payer: Self-pay

## 2021-04-23 ENCOUNTER — Other Ambulatory Visit: Payer: Medicaid Other

## 2021-04-23 DIAGNOSIS — A09 Infectious gastroenteritis and colitis, unspecified: Secondary | ICD-10-CM

## 2021-04-23 DIAGNOSIS — K219 Gastro-esophageal reflux disease without esophagitis: Secondary | ICD-10-CM

## 2021-04-23 DIAGNOSIS — R7401 Elevation of levels of liver transaminase levels: Secondary | ICD-10-CM

## 2021-04-23 DIAGNOSIS — R748 Abnormal levels of other serum enzymes: Secondary | ICD-10-CM

## 2021-04-23 LAB — HEPATIC FUNCTION PANEL
ALT: 142 U/L — ABNORMAL HIGH (ref 0–53)
AST: 64 U/L — ABNORMAL HIGH (ref 0–37)
Albumin: 4.7 g/dL (ref 3.5–5.2)
Alkaline Phosphatase: 40 U/L (ref 39–117)
Bilirubin, Direct: 0.1 mg/dL (ref 0.0–0.3)
Total Bilirubin: 0.6 mg/dL (ref 0.2–1.2)
Total Protein: 7.5 g/dL (ref 6.0–8.3)

## 2021-04-24 LAB — HEPATITIS A ANTIBODY, IGM: Hep A IgM: NONREACTIVE

## 2021-04-28 ENCOUNTER — Other Ambulatory Visit: Payer: Self-pay

## 2021-04-28 DIAGNOSIS — R748 Abnormal levels of other serum enzymes: Secondary | ICD-10-CM

## 2021-04-28 DIAGNOSIS — R7401 Elevation of levels of liver transaminase levels: Secondary | ICD-10-CM

## 2021-05-11 ENCOUNTER — Telehealth: Payer: Self-pay

## 2021-05-11 NOTE — Telephone Encounter (Signed)
-----   Message from Missy Sabins, RN sent at 04/28/2021  4:56 PM EDT ----- Regarding: Labs Hepatic function panel, order in epic

## 2021-05-11 NOTE — Telephone Encounter (Signed)
Lab reminder sent to patient via My Chart.  

## 2021-05-18 ENCOUNTER — Other Ambulatory Visit: Payer: Medicaid Other

## 2021-05-18 DIAGNOSIS — R7401 Elevation of levels of liver transaminase levels: Secondary | ICD-10-CM

## 2021-05-18 DIAGNOSIS — R748 Abnormal levels of other serum enzymes: Secondary | ICD-10-CM

## 2021-05-18 LAB — HEPATIC FUNCTION PANEL
ALT: 32 U/L (ref 0–53)
AST: 16 U/L (ref 0–37)
Albumin: 4.4 g/dL (ref 3.5–5.2)
Alkaline Phosphatase: 35 U/L — ABNORMAL LOW (ref 39–117)
Bilirubin, Direct: 0.1 mg/dL (ref 0.0–0.3)
Total Bilirubin: 0.5 mg/dL (ref 0.2–1.2)
Total Protein: 7.1 g/dL (ref 6.0–8.3)

## 2021-05-19 ENCOUNTER — Other Ambulatory Visit: Payer: Self-pay

## 2021-05-19 DIAGNOSIS — R7401 Elevation of levels of liver transaminase levels: Secondary | ICD-10-CM

## 2021-06-29 ENCOUNTER — Other Ambulatory Visit (INDEPENDENT_AMBULATORY_CARE_PROVIDER_SITE_OTHER): Payer: Medicaid Other

## 2021-06-29 DIAGNOSIS — R7401 Elevation of levels of liver transaminase levels: Secondary | ICD-10-CM | POA: Diagnosis not present

## 2021-06-29 LAB — HEPATIC FUNCTION PANEL
ALT: 23 U/L (ref 0–53)
AST: 15 U/L (ref 0–37)
Albumin: 4.3 g/dL (ref 3.5–5.2)
Alkaline Phosphatase: 36 U/L — ABNORMAL LOW (ref 39–117)
Bilirubin, Direct: 0.1 mg/dL (ref 0.0–0.3)
Total Bilirubin: 0.5 mg/dL (ref 0.2–1.2)
Total Protein: 6.8 g/dL (ref 6.0–8.3)

## 2021-06-30 ENCOUNTER — Other Ambulatory Visit: Payer: Self-pay

## 2021-06-30 DIAGNOSIS — R748 Abnormal levels of other serum enzymes: Secondary | ICD-10-CM

## 2021-06-30 DIAGNOSIS — R7401 Elevation of levels of liver transaminase levels: Secondary | ICD-10-CM

## 2021-11-17 ENCOUNTER — Other Ambulatory Visit (INDEPENDENT_AMBULATORY_CARE_PROVIDER_SITE_OTHER): Payer: Medicaid Other

## 2021-11-17 DIAGNOSIS — R748 Abnormal levels of other serum enzymes: Secondary | ICD-10-CM

## 2021-11-17 DIAGNOSIS — R7401 Elevation of levels of liver transaminase levels: Secondary | ICD-10-CM | POA: Diagnosis not present

## 2021-11-17 LAB — HEPATIC FUNCTION PANEL
ALT: 12 U/L (ref 0–53)
AST: 14 U/L (ref 0–37)
Albumin: 4.5 g/dL (ref 3.5–5.2)
Alkaline Phosphatase: 42 U/L (ref 39–117)
Bilirubin, Direct: 0.1 mg/dL (ref 0.0–0.3)
Total Bilirubin: 0.6 mg/dL (ref 0.2–1.2)
Total Protein: 7.3 g/dL (ref 6.0–8.3)

## 2022-07-06 DIAGNOSIS — R748 Abnormal levels of other serum enzymes: Secondary | ICD-10-CM

## 2022-07-06 DIAGNOSIS — R7401 Elevation of levels of liver transaminase levels: Secondary | ICD-10-CM

## 2022-07-06 NOTE — Telephone Encounter (Signed)
-----   Message from Cooper Render, CMA sent at 11/19/2021  1:53 PM EST ----- Regarding: due for LFTs Patient due for LFTs in August or Sept
